# Patient Record
Sex: Female | Born: 1945 | Race: White | Hispanic: No | Marital: Single
Health system: Southern US, Community
[De-identification: ages and names within clinical notes are randomized; demographics above are authoritative.]

---

## 2008-05-09 ENCOUNTER — Ambulatory Visit: Payer: Self-pay | Admitting: Internal Medicine

## 2008-05-19 ENCOUNTER — Ambulatory Visit: Payer: Self-pay | Admitting: Family Medicine

## 2008-05-30 ENCOUNTER — Ambulatory Visit: Payer: Self-pay | Admitting: Family Medicine

## 2011-05-10 ENCOUNTER — Other Ambulatory Visit (HOSPITAL_COMMUNITY): Payer: Self-pay | Admitting: *Deleted

## 2011-05-10 DIAGNOSIS — S322XXA Fracture of coccyx, initial encounter for closed fracture: Secondary | ICD-10-CM

## 2011-05-13 ENCOUNTER — Ambulatory Visit (HOSPITAL_COMMUNITY)
Admission: RE | Admit: 2011-05-13 | Discharge: 2011-05-13 | Disposition: A | Discharge status: Home / Self Care | Payer: Medicare Other | Source: Ambulatory Visit | Attending: *Deleted | Admitting: *Deleted

## 2011-05-13 DIAGNOSIS — S322XXA Fracture of coccyx, initial encounter for closed fracture: Secondary | ICD-10-CM

## 2011-05-16 ENCOUNTER — Other Ambulatory Visit (HOSPITAL_COMMUNITY): Payer: Self-pay | Admitting: Interventional Radiology

## 2011-05-16 DIAGNOSIS — IMO0002 Reserved for concepts with insufficient information to code with codable children: Secondary | ICD-10-CM

## 2011-05-20 ENCOUNTER — Ambulatory Visit (HOSPITAL_COMMUNITY)
Admission: RE | Admit: 2011-05-20 | Discharge: 2011-05-20 | Disposition: A | Discharge status: Home / Self Care | Payer: Medicare Other | Source: Ambulatory Visit | Attending: Interventional Radiology | Admitting: Interventional Radiology

## 2011-05-20 DIAGNOSIS — M8448XA Pathological fracture, other site, initial encounter for fracture: Secondary | ICD-10-CM | POA: Insufficient documentation

## 2011-05-20 DIAGNOSIS — IMO0002 Reserved for concepts with insufficient information to code with codable children: Secondary | ICD-10-CM

## 2011-05-20 DIAGNOSIS — G35 Multiple sclerosis: Secondary | ICD-10-CM | POA: Insufficient documentation

## 2011-05-20 DIAGNOSIS — R569 Unspecified convulsions: Secondary | ICD-10-CM | POA: Insufficient documentation

## 2011-05-20 DIAGNOSIS — J449 Chronic obstructive pulmonary disease, unspecified: Secondary | ICD-10-CM | POA: Insufficient documentation

## 2011-05-20 DIAGNOSIS — J4489 Other specified chronic obstructive pulmonary disease: Secondary | ICD-10-CM | POA: Insufficient documentation

## 2011-05-20 LAB — CBC
MCV: 92.7 fL (ref 78.0–100.0)
Platelets: 234 10*3/uL (ref 150–400)
RBC: 4.53 MIL/uL (ref 3.87–5.11)
RDW: 13.7 % (ref 11.5–15.5)
WBC: 9.5 10*3/uL (ref 4.0–10.5)

## 2011-05-20 LAB — BASIC METABOLIC PANEL
CO2: 29 mEq/L (ref 19–32)
Chloride: 100 mEq/L (ref 96–112)
Creatinine, Ser: 0.61 mg/dL (ref 0.50–1.10)
GFR calc Af Amer: >60 mL/min (ref >60)
Potassium: 4 mEq/L (ref 3.5–5.1)

## 2011-05-20 LAB — PROTIME-INR
INR: 1.04 (ref 0.00–1.49)
Prothrombin Time: 13.8 seconds (ref 11.6–15.2)

## 2011-05-20 MED ORDER — IOHEXOL 300 MG/ML  SOLN: 50.0000 mL | Freq: Once | INTRAMUSCULAR | Status: AC | PRN

## 2011-05-23 ENCOUNTER — Other Ambulatory Visit (HOSPITAL_COMMUNITY): Payer: Self-pay | Admitting: Interventional Radiology

## 2011-05-23 DIAGNOSIS — Z09 Encounter for follow-up examination after completed treatment for conditions other than malignant neoplasm: Secondary | ICD-10-CM

## 2011-05-23 DIAGNOSIS — IMO0002 Reserved for concepts with insufficient information to code with codable children: Secondary | ICD-10-CM

## 2011-06-06 ENCOUNTER — Ambulatory Visit (HOSPITAL_COMMUNITY)
Admission: RE | Admit: 2011-06-06 | Discharge: 2011-06-06 | Disposition: A | Discharge status: Home / Self Care | Payer: Medicare Other | Source: Ambulatory Visit | Attending: Interventional Radiology | Admitting: Interventional Radiology

## 2011-06-06 DIAGNOSIS — Z09 Encounter for follow-up examination after completed treatment for conditions other than malignant neoplasm: Secondary | ICD-10-CM

## 2011-06-06 DIAGNOSIS — IMO0002 Reserved for concepts with insufficient information to code with codable children: Secondary | ICD-10-CM

## 2014-04-22 ENCOUNTER — Ambulatory Visit: Payer: Self-pay | Admitting: Internal Medicine

## 2014-04-22 ENCOUNTER — Inpatient Hospital Stay: Payer: Self-pay | Admitting: Internal Medicine

## 2014-04-22 LAB — URINALYSIS, COMPLETE
BILIRUBIN, UR: NEGATIVE
KETONE: NEGATIVE
LEUKOCYTE ESTERASE: NEGATIVE
NITRITE: NEGATIVE
PROTEIN: NEGATIVE
Ph: 5 (ref 4.5–8.0)
SQUAMOUS EPITHELIAL: NONE SEEN
Specific Gravity: 1.035 (ref 1.003–1.030)
WBC UR: 2 /HPF (ref 0–5)

## 2014-04-22 LAB — CBC WITH DIFFERENTIAL/PLATELET
BASOS ABS: 0 10*3/uL (ref 0.0–0.1)
BASOS PCT: 0.2 %
EOS ABS: 0 10*3/uL (ref 0.0–0.7)
EOS PCT: 0 %
HCT: 38.6 % (ref 35.0–47.0)
HGB: 12.1 g/dL (ref 12.0–16.0)
LYMPHS ABS: 1.2 10*3/uL (ref 1.0–3.6)
Lymphocyte %: 5.2 %
MCH: 29.1 pg (ref 26.0–34.0)
MCHC: 31.2 g/dL — ABNORMAL LOW (ref 32.0–36.0)
MCV: 93 fL (ref 80–100)
Monocyte #: 0.8 x10 3/mm (ref 0.2–0.9)
Monocyte %: 3.5 %
Neutrophil #: 20.5 10*3/uL — ABNORMAL HIGH (ref 1.4–6.5)
Neutrophil %: 91.1 %
Platelet: 284 10*3/uL (ref 150–440)
RBC: 4.15 10*6/uL (ref 3.80–5.20)
RDW: 16.8 % — ABNORMAL HIGH (ref 11.5–14.5)
WBC: 22.5 10*3/uL — ABNORMAL HIGH (ref 3.6–11.0)

## 2014-04-22 LAB — COMPREHENSIVE METABOLIC PANEL
ALBUMIN: 1.9 g/dL — AB (ref 3.4–5.0)
ANION GAP: 9 (ref 7–16)
Alkaline Phosphatase: 268 U/L — ABNORMAL HIGH
BILIRUBIN TOTAL: 0.3 mg/dL (ref 0.2–1.0)
BUN: 29 mg/dL — AB (ref 7–18)
CALCIUM: 9 mg/dL (ref 8.5–10.1)
CO2: 24 mmol/L (ref 21–32)
Chloride: 114 mmol/L — ABNORMAL HIGH (ref 98–107)
Creatinine: 1.14 mg/dL (ref 0.60–1.30)
EGFR (African American): 57 — ABNORMAL LOW
GFR CALC NON AF AMER: 49 — AB
Glucose: 547 mg/dL (ref 65–99)
Osmolality: 323 (ref 275–301)
POTASSIUM: 3.8 mmol/L (ref 3.5–5.1)
SGOT(AST): 17 U/L (ref 15–37)
SGPT (ALT): 47 U/L
Sodium: 147 mmol/L — ABNORMAL HIGH (ref 136–145)
TOTAL PROTEIN: 7.6 g/dL (ref 6.4–8.2)

## 2014-04-22 LAB — TROPONIN I

## 2014-04-22 LAB — HEMOGLOBIN A1C: Hemoglobin A1C: 8.2 % — ABNORMAL HIGH (ref 4.2–6.3)

## 2014-04-22 LAB — MAGNESIUM: MAGNESIUM: 2 mg/dL

## 2014-04-22 LAB — PHOSPHORUS: Phosphorus: 2.7 mg/dL (ref 2.5–4.9)

## 2014-04-22 LAB — PROTIME-INR
INR: 1.2
Prothrombin Time: 14.6 secs (ref 11.5–14.7)

## 2014-04-24 LAB — CBC WITH DIFFERENTIAL/PLATELET
BASOS PCT: 0.2 %
Basophil #: 0 10*3/uL (ref 0.0–0.1)
Eosinophil #: 0.1 10*3/uL (ref 0.0–0.7)
Eosinophil %: 0.3 %
HCT: 33.4 % — AB (ref 35.0–47.0)
HGB: 10.2 g/dL — ABNORMAL LOW (ref 12.0–16.0)
Lymphocyte #: 1.1 10*3/uL (ref 1.0–3.6)
Lymphocyte %: 4.6 %
MCH: 28.4 pg (ref 26.0–34.0)
MCHC: 30.6 g/dL — AB (ref 32.0–36.0)
MCV: 93 fL (ref 80–100)
MONO ABS: 0.6 x10 3/mm (ref 0.2–0.9)
MONOS PCT: 2.5 %
NEUTROS ABS: 22.9 10*3/uL — AB (ref 1.4–6.5)
Neutrophil %: 92.4 %
PLATELETS: 248 10*3/uL (ref 150–440)
RBC: 3.6 10*6/uL — ABNORMAL LOW (ref 3.80–5.20)
RDW: 16.7 % — ABNORMAL HIGH (ref 11.5–14.5)
WBC: 24.8 10*3/uL — ABNORMAL HIGH (ref 3.6–11.0)

## 2014-04-24 LAB — BASIC METABOLIC PANEL
Anion Gap: 6 — ABNORMAL LOW (ref 7–16)
BUN: 11 mg/dL (ref 7–18)
CHLORIDE: 101 mmol/L (ref 98–107)
CREATININE: 0.76 mg/dL (ref 0.60–1.30)
Calcium, Total: 8.4 mg/dL — ABNORMAL LOW (ref 8.5–10.1)
Co2: 31 mmol/L (ref 21–32)
EGFR (African American): >60
EGFR (Non-African Amer.): >60
GLUCOSE: 310 mg/dL — AB (ref 65–99)
OSMOLALITY: 287 (ref 275–301)
POTASSIUM: 2.9 mmol/L — AB (ref 3.5–5.1)
SODIUM: 138 mmol/L (ref 136–145)

## 2014-04-24 LAB — URINE CULTURE

## 2014-04-24 LAB — CREATININE, SERUM
CREATININE: 0.83 mg/dL (ref 0.60–1.30)
EGFR (Non-African Amer.): >60

## 2014-04-25 LAB — BASIC METABOLIC PANEL
ANION GAP: 6 — AB (ref 7–16)
BUN: 6 mg/dL — ABNORMAL LOW (ref 7–18)
CALCIUM: 8.5 mg/dL (ref 8.5–10.1)
CHLORIDE: 107 mmol/L (ref 98–107)
CO2: 27 mmol/L (ref 21–32)
Creatinine: 0.7 mg/dL (ref 0.60–1.30)
Glucose: 150 mg/dL — ABNORMAL HIGH (ref 65–99)
Osmolality: 280 (ref 275–301)
Potassium: 3.6 mmol/L (ref 3.5–5.1)
Sodium: 140 mmol/L (ref 136–145)

## 2014-04-25 LAB — PLATELET COUNT: PLATELETS: 247 10*3/uL (ref 150–440)

## 2014-04-26 LAB — CBC WITH DIFFERENTIAL/PLATELET
BASOS PCT: 0.3 %
Basophil #: 0 10*3/uL (ref 0.0–0.1)
EOS ABS: 0.1 10*3/uL (ref 0.0–0.7)
Eosinophil %: 0.6 %
HCT: 30.1 % — AB (ref 35.0–47.0)
HGB: 9.4 g/dL — ABNORMAL LOW (ref 12.0–16.0)
Lymphocyte #: 1.3 10*3/uL (ref 1.0–3.6)
Lymphocyte %: 7.1 %
MCH: 28 pg (ref 26.0–34.0)
MCHC: 31.3 g/dL — AB (ref 32.0–36.0)
MCV: 90 fL (ref 80–100)
MONO ABS: 0.8 x10 3/mm (ref 0.2–0.9)
MONOS PCT: 4.1 %
NEUTROS ABS: 16.3 10*3/uL — AB (ref 1.4–6.5)
Neutrophil %: 87.9 %
Platelet: 299 10*3/uL (ref 150–440)
RBC: 3.37 10*6/uL — ABNORMAL LOW (ref 3.80–5.20)
RDW: 16.4 % — ABNORMAL HIGH (ref 11.5–14.5)
WBC: 18.5 10*3/uL — ABNORMAL HIGH (ref 3.6–11.0)

## 2014-04-26 LAB — BASIC METABOLIC PANEL
Anion Gap: 3 — ABNORMAL LOW (ref 7–16)
BUN: 9 mg/dL (ref 7–18)
CALCIUM: 8.4 mg/dL — AB (ref 8.5–10.1)
CO2: 32 mmol/L (ref 21–32)
Chloride: 106 mmol/L (ref 98–107)
Creatinine: 0.75 mg/dL (ref 0.60–1.30)
EGFR (Non-African Amer.): >60
Glucose: 84 mg/dL (ref 65–99)
Osmolality: 279 (ref 275–301)
POTASSIUM: 3.2 mmol/L — AB (ref 3.5–5.1)
Sodium: 141 mmol/L (ref 136–145)

## 2014-04-27 LAB — CULTURE, BLOOD (SINGLE)

## 2014-05-22 ENCOUNTER — Ambulatory Visit: Payer: Self-pay | Admitting: Internal Medicine

## 2014-07-31 ENCOUNTER — Inpatient Hospital Stay: Payer: Self-pay | Admitting: Internal Medicine

## 2014-07-31 LAB — URINALYSIS, COMPLETE
BILIRUBIN, UR: NEGATIVE
Glucose,UR: >=500 mg/dL (ref 0–75)
KETONE: NEGATIVE
NITRITE: NEGATIVE
Ph: 5 (ref 4.5–8.0)
RBC,UR: 2 /HPF (ref 0–5)
Specific Gravity: 1.021 (ref 1.003–1.030)
Squamous Epithelial: 4
WBC UR: 5 /HPF (ref 0–5)

## 2014-07-31 LAB — COMPREHENSIVE METABOLIC PANEL
ALBUMIN: 2.1 g/dL — AB (ref 3.4–5.0)
ALT: 26 U/L
ANION GAP: 9 (ref 7–16)
Alkaline Phosphatase: 205 U/L — ABNORMAL HIGH
BUN: 40 mg/dL — AB (ref 7–18)
Bilirubin,Total: 0.2 mg/dL (ref 0.2–1.0)
CHLORIDE: 122 mmol/L — AB (ref 98–107)
CO2: 28 mmol/L (ref 21–32)
Calcium, Total: 8.9 mg/dL (ref 8.5–10.1)
Creatinine: 1.17 mg/dL (ref 0.60–1.30)
GFR CALC AF AMER: 59 — AB
GFR CALC NON AF AMER: 49 — AB
Glucose: 294 mg/dL — ABNORMAL HIGH (ref 65–99)
Osmolality: 335 (ref 275–301)
Potassium: 3.3 mmol/L — ABNORMAL LOW (ref 3.5–5.1)
SGOT(AST): 23 U/L (ref 15–37)
Sodium: 159 mmol/L — ABNORMAL HIGH (ref 136–145)
Total Protein: 8.2 g/dL (ref 6.4–8.2)

## 2014-07-31 LAB — CBC WITH DIFFERENTIAL/PLATELET
BASOS ABS: 0 10*3/uL (ref 0.0–0.1)
Basophil %: 0 %
EOS PCT: 0.3 %
Eosinophil #: 0.1 10*3/uL (ref 0.0–0.7)
HCT: 41.4 % (ref 35.0–47.0)
HGB: 12.7 g/dL (ref 12.0–16.0)
LYMPHS PCT: 5.7 %
Lymphocyte #: 1.2 10*3/uL (ref 1.0–3.6)
MCH: 28.3 pg (ref 26.0–34.0)
MCHC: 30.6 g/dL — AB (ref 32.0–36.0)
MCV: 92 fL (ref 80–100)
MONO ABS: 0.7 x10 3/mm (ref 0.2–0.9)
MONOS PCT: 3.3 %
Neutrophil #: 19.6 10*3/uL — ABNORMAL HIGH (ref 1.4–6.5)
Neutrophil %: 90.7 %
Platelet: 262 10*3/uL (ref 150–440)
RBC: 4.48 10*6/uL (ref 3.80–5.20)
RDW: 17.1 % — ABNORMAL HIGH (ref 11.5–14.5)
WBC: 21.6 10*3/uL — AB (ref 3.6–11.0)

## 2014-07-31 LAB — PHOSPHORUS: Phosphorus: 2.6 mg/dL (ref 2.5–4.9)

## 2014-07-31 LAB — PROTIME-INR
INR: 1.3
Prothrombin Time: 15.7 secs — ABNORMAL HIGH (ref 11.5–14.7)

## 2014-07-31 LAB — MAGNESIUM: MAGNESIUM: 2.4 mg/dL

## 2014-07-31 LAB — TROPONIN I: Troponin-I: <0.02 ng/mL

## 2014-08-01 LAB — CBC WITH DIFFERENTIAL/PLATELET
BASOS PCT: 0.2 %
Basophil #: 0 10*3/uL (ref 0.0–0.1)
EOS ABS: 0.3 10*3/uL (ref 0.0–0.7)
EOS PCT: 1.5 %
HCT: 34.7 % — ABNORMAL LOW (ref 35.0–47.0)
HGB: 10.3 g/dL — ABNORMAL LOW (ref 12.0–16.0)
LYMPHS ABS: 2.6 10*3/uL (ref 1.0–3.6)
Lymphocyte %: 13 %
MCH: 27.9 pg (ref 26.0–34.0)
MCHC: 29.7 g/dL — AB (ref 32.0–36.0)
MCV: 94 fL (ref 80–100)
MONOS PCT: 3 %
Monocyte #: 0.6 x10 3/mm (ref 0.2–0.9)
NEUTROS ABS: 16.3 10*3/uL — AB (ref 1.4–6.5)
NEUTROS PCT: 82.3 %
PLATELETS: 199 10*3/uL (ref 150–440)
RBC: 3.69 10*6/uL — AB (ref 3.80–5.20)
RDW: 17.6 % — AB (ref 11.5–14.5)
WBC: 19.8 10*3/uL — ABNORMAL HIGH (ref 3.6–11.0)

## 2014-08-01 LAB — BASIC METABOLIC PANEL
Anion Gap: 6 — ABNORMAL LOW (ref 7–16)
BUN: 25 mg/dL — ABNORMAL HIGH (ref 7–18)
CALCIUM: 7.7 mg/dL — AB (ref 8.5–10.1)
CO2: 23 mmol/L (ref 21–32)
Chloride: 127 mmol/L — ABNORMAL HIGH (ref 98–107)
Creatinine: 0.89 mg/dL (ref 0.60–1.30)
EGFR (African American): >60
EGFR (Non-African Amer.): >60
Glucose: 151 mg/dL — ABNORMAL HIGH (ref 65–99)
Osmolality: 316 (ref 275–301)
POTASSIUM: 4.1 mmol/L (ref 3.5–5.1)
SODIUM: 156 mmol/L — AB (ref 136–145)

## 2014-08-02 LAB — CBC WITH DIFFERENTIAL/PLATELET
Basophil #: 0 10*3/uL (ref 0.0–0.1)
Basophil %: 0.2 %
Eosinophil #: 0.8 10*3/uL — ABNORMAL HIGH (ref 0.0–0.7)
Eosinophil %: 5.6 %
HCT: 38.4 % (ref 35.0–47.0)
HGB: 11.7 g/dL — ABNORMAL LOW (ref 12.0–16.0)
LYMPHS PCT: 14.9 %
Lymphocyte #: 2.2 10*3/uL (ref 1.0–3.6)
MCH: 27.9 pg (ref 26.0–34.0)
MCHC: 30.5 g/dL — AB (ref 32.0–36.0)
MCV: 91 fL (ref 80–100)
MONO ABS: 0.5 x10 3/mm (ref 0.2–0.9)
MONOS PCT: 3.7 %
NEUTROS ABS: 11.1 10*3/uL — AB (ref 1.4–6.5)
Neutrophil %: 75.6 %
Platelet: 209 10*3/uL (ref 150–440)
RBC: 4.21 10*6/uL (ref 3.80–5.20)
RDW: 17.3 % — ABNORMAL HIGH (ref 11.5–14.5)
WBC: 14.7 10*3/uL — ABNORMAL HIGH (ref 3.6–11.0)

## 2014-08-02 LAB — BASIC METABOLIC PANEL
ANION GAP: 8 (ref 7–16)
BUN: 8 mg/dL (ref 7–18)
CALCIUM: 7.7 mg/dL — AB (ref 8.5–10.1)
CO2: 27 mmol/L (ref 21–32)
Chloride: 107 mmol/L (ref 98–107)
Creatinine: 0.63 mg/dL (ref 0.60–1.30)
EGFR (African American): >60
GLUCOSE: 92 mg/dL (ref 65–99)
OSMOLALITY: 281 (ref 275–301)
Potassium: 3.2 mmol/L — ABNORMAL LOW (ref 3.5–5.1)
Sodium: 142 mmol/L (ref 136–145)

## 2014-08-02 LAB — POTASSIUM: POTASSIUM: 3 mmol/L — AB (ref 3.5–5.1)

## 2014-08-02 LAB — SODIUM: Sodium: 139 mmol/L (ref 136–145)

## 2014-08-02 LAB — MAGNESIUM: MAGNESIUM: 1.9 mg/dL

## 2014-08-03 LAB — BASIC METABOLIC PANEL
ANION GAP: 5 — AB (ref 7–16)
BUN: 8 mg/dL (ref 7–18)
CHLORIDE: 104 mmol/L (ref 98–107)
CREATININE: 0.81 mg/dL (ref 0.60–1.30)
Calcium, Total: 8.1 mg/dL — ABNORMAL LOW (ref 8.5–10.1)
Co2: 29 mmol/L (ref 21–32)
EGFR (Non-African Amer.): >60
GLUCOSE: 205 mg/dL — AB (ref 65–99)
Osmolality: 280 (ref 275–301)
POTASSIUM: 3.7 mmol/L (ref 3.5–5.1)
Sodium: 138 mmol/L (ref 136–145)

## 2014-08-03 LAB — URINE CULTURE

## 2014-08-03 LAB — CLOSTRIDIUM DIFFICILE(ARMC)

## 2014-08-04 LAB — CBC WITH DIFFERENTIAL/PLATELET
Basophil #: 0 10*3/uL (ref 0.0–0.1)
Basophil %: 0 %
EOS PCT: 0.1 %
Eosinophil #: 0 10*3/uL (ref 0.0–0.7)
HCT: 39.9 % (ref 35.0–47.0)
HGB: 12.3 g/dL (ref 12.0–16.0)
Lymphocyte #: 1.2 10*3/uL (ref 1.0–3.6)
Lymphocyte %: 10.6 %
MCH: 28.2 pg (ref 26.0–34.0)
MCHC: 30.9 g/dL — AB (ref 32.0–36.0)
MCV: 91 fL (ref 80–100)
MONO ABS: 0.1 x10 3/mm — AB (ref 0.2–0.9)
MONOS PCT: 1 %
NEUTROS PCT: 88.3 %
Neutrophil #: 10.1 10*3/uL — ABNORMAL HIGH (ref 1.4–6.5)
PLATELETS: 246 10*3/uL (ref 150–440)
RBC: 4.37 10*6/uL (ref 3.80–5.20)
RDW: 16.7 % — AB (ref 11.5–14.5)
WBC: 11.4 10*3/uL — AB (ref 3.6–11.0)

## 2014-08-05 LAB — CULTURE, BLOOD (SINGLE)

## 2014-08-05 LAB — STOOL CULTURE

## 2014-09-22 DEATH — deceased

## 2014-12-13 NOTE — H&P (Signed)
PATIENT NAME:  Frances Potter, Frances Potter MR#:  [id number] DATE OF BIRTH:  06-24-1946  DATE OF ADMISSION:  04/22/2014  REFERRING PHYSICIAN: Coolidge BreezeGraydon S. Goodman, MD  PRIMARY CARE PHYSICIAN: Burley SaverL. Katherine Bliss, MD    CHIEF COMPLAINT: Fever.   HISTORY OF PRESENT ILLNESS: A 69 year old Caucasian female with a history of multiple sclerosis who currently resides at a nursing facility and presented with fever. Patient herself is unable to provide any meaningful information given mental status and medical condition; however, fever noted by caregiver documented at 101 degrees Fahrenheit. She also describes having associated 1- to 2-day duration of cough, nonproductive in nature, but states the patient does sound markedly congested as well as increased lethargy today. On EMS arrival, noted to be hypoxemic, requiring supplemental O2 to maintain SaO2 greater than 92%. Once again, the patient unable to provide any meaningful information given mental status and medical condition.  REVIEW OF SYSTEMS: Unobtainable at this time given mental status and medical condition.   PAST MEDICAL HISTORY: Multiple sclerosis, anxiety and depression not otherwise specified.   SOCIAL HISTORY: No alcohol, tobacco, or drug usage. Currently resides at [facility]. At baseline, she is bedbound. She is also confused but conversant, with a change noted today by her normal caregiver.   FAMILY HISTORY: Unobtainable given patient's mental status and medical condition.   ALLERGIES: No known drug allergies.   HOME MEDICATIONS: Ultram extended release 100 mg p.o. at bedtime, Effexor extended release 150 mg p.o. q. daily, Remeron 50 mg p.o. at bedtime, trazodone 50 mg p.o. at bedtime, albuterol 2.5 mg 3 mL inhalation once daily, DuoNeb treatments 3 mL inhalation q. 4 hours as needed for shortness of breath, Senokot 1 tab p.o. 3 times daily, Macrobid 100 mg p.o. q. daily, Citracal plus D 1 tab p.o. q. daily, vitamin B complex 1 tab p.o. q. daily,  multivitamin 1 tab p.o. q. daily, folic acid 0.4 mg p.o. q. daily, vitamin B12, 500 mcg 2 tabs sublingual q. daily.   PHYSICAL EXAMINATION:  VITAL SIGNS: Temperature 100.7 degrees Fahrenheit, heart rate 112, respirations 18, blood pressure 111/80, saturating 88% on supplemental O2; currently saturating 100% on supplemental O2. Weight 51.7 kg, BMI 20.9.  GENERAL: Chronically ill and frail-appearing Caucasian female currently in minimal distress given respiratory status.  HEAD: Normocephalic, atraumatic.  EYES: Pupils equal, round, reactive to light. Extraocular muscles intact. No scleral icterus.  MOUTH: Moist mucous membranes. Dentition intact. No abscess noted. EARS, NOSE, THROAT: Clear without exudates. No external lesions.  NECK: Supple. No thyromegaly. No nodules. No JVD.  PULMONARY: Diffuse coarse breath sounds bilaterally with scant rhonchi at the bases, most prominent at the left base. Good air entry bilaterally.  CHEST: Nontender to palpation.  CARDIOVASCULAR: S1, S2. Tachycardic. No murmurs, rubs, or gallops. No edema. Pedal pulses 2+ bilaterally. GASTROINTESTINAL: Soft, nontender, nondistended. No masses. Positive bowel sounds. No hepatosplenomegaly.  MUSCULOSKELETAL: No swelling, clubbing, or edema. Passive range of motion full in all extremities.  NEUROLOGIC: Unable to fully assess given patient's mental status and medical condition. However, she does respond to simple commands in a seemingly appropriate fashion. SKIN: No ulceration, lesions, rash, cyanosis. Skin warm, dry. Turgor intact.  PSYCHIATRIC: Unable to fully assess given patient's mental status and medical condition. However, mood, affect blunted. She is awake; however, unable to answer any questions. Insight and judgment would be considered poor at this time.  LABORATORY DATA: Chest x-ray performed revealing asymmetric coarse interstitial air-space opacities. Remainder of laboratory data: Sodium 147, potassium 3.8, chloride  114, bicarbonate  24, BUN 29, creatinine 1.14, glucose 547. LFTs: Albumin 1.9, alkaline phosphatase 268. WBC 22.5, hemoglobin 12.1, platelets of 284,000. Urinalysis negative for evidence of infection. Lactic acid of 2.7.   ASSESSMENT AND PLAN: A 69 year old Caucasian female with a history of multiple sclerosis presenting with fever.  1.  Sepsis. Meeting septic criteria by leukocytosis, heart rate. Temperature is most likely secondary to healthcare-associated pneumonia. Panculture including blood, sputum, and urine. Intravenous fluid hydration to keep mean arterial pressure greater than 65. Antibiotic coverage with vancomycin, Zosyn, Levaquin for healthcare-associated pneumonia coverage. Provide supplemental oxygen to keep oxygen saturation greater than 92% as well as DuoNeb treatments q. 4 hours.  2.  Hyperglycemia. Not a known diabetic. Insulin sliding scale with q. 6 hour Accu-Cheks. Check hemoglobin A1c.  3.  Hypernatremia with a corrected sodium level of 151. Intravenous fluid hydration as above, Whenever blood pressure is stabilized, can change the normal saline to half normal saline to increase free water.  4.  Lactic acidosis. Intravenous fluid hydration as above to keep mean arterial pressure greater than 65. 5.  Venous thromboembolism prophylaxis with heparin subcutaneous.  CODE STATUS:  Patient  full code as discussed with bedside caregiver at length.   TIME SPENT: 45 minutes.    ____________________________ Cletis Athens. Jamieson Hetland, MD dkh:ST D: 04/22/2014 22:25:51 ET T: 04/22/2014 22:58:30 ET JOB#: [id number]  cc: Cletis Athens. Ixchel Duck, MD, <Dictator> Reine Bristow Synetta Shadow MD ELECTRONICALLY SIGNED 04/23/2014 0:33

## 2014-12-13 NOTE — Discharge Summary (Signed)
PATIENT NAME:  Frances Potter, Frances Potter MR#:  [id number] DATE OF BIRTH:  08-Feb-1946  DATE OF ADMISSION:  07/31/2014 DATE OF DISCHARGE: 08/04/2014   ADMITTING DIAGNOSES: Sepsis, pneumonia.   DISCHARGE DIAGNOSES:  1.  Systemic inflammatory response syndrome.  2.  Acute respiratory failure with hypoxia.  3.  Bilateral pneumonia due to unclear bacterial agent; suspected aspiration pneumonia.  4.  Dysphagia, now on pureed diet with nectar thick liquids.  5.  Dehydration.  6.  Hyponatremia due to dehydration.  7.  Insulin dependent diabetes mellitus.  8.  Anemia of chronic disease.  9.  Hypokalemia.  10.  Recurrent rash of unclear etiology, suspect a drug eruption.  11.  Insomnia, due to pruritic rash and steroids.  12.  History of stroke.  13.  Multiple sclerosis.  14.  Post infarct dementia with expressive aphasia.  15.  Depression.  16.  Anxiety.  17.  Right-sided weakness due to stroke, and a wheelchair bound status.   DISCHARGE CONDITION: Stable.   DISCHARGE MEDICATIONS:  1.  The patient is to continue albuterol 3 mL once daily.  2.  Effexor-XR 150 mg p.o. daily.  3.  Trazodone 50 mg p.o. at bedtime.  4.  DuoNebs 30 mL every 4 hours as needed.  5.  Atropine ophthalmic solution 3-5 drops every 2 hours as needed for excessive secretions.  6.  Hydroxyzine/hydrochloride 25 mg twice daily.  7.  Senokot 8.6 mg 3 times daily.  8.  Levemir 10 units subcutaneously at bedtime.  9.  Remeron 15 mg p.o. at bedtime.  10.  MetPhos 2 ounces k times daily.  11.  Lorazepam 0.5 mg every 4 hours as needed.  12.  Tramadol extended release 100 mg once daily as needed.  13.  Morphine 100 mL in 5 mL oral solution, 0.25 mL every 2 hours as needed.  14.  Prednisone taper 50 mg p.o. once on 08/05/2014, then taper by 10 mg every 2 days until stopped.  15.  Adoxa, which doxycycline monohydrate 100 mg twice daily for 9 more days.  16.  Insulin aspart sliding scale subcutaneously 4 times daily before meals and at  bedtime as needed.  17.  Doxepin 25 mg p.o. in the evening as needed.  18.  Docusate sodium 100 mg twice daily as needed. The patient is not to take Levaquin.   HOME OXYGEN: None.   DIET: 2 g salt, low-fat, low-cholesterol, carbohydrate-controlled diet. Pureed consistency, nectar thick liquids.   ACTIVITY LIMITATIONS: As tolerated.   REFERRAL: To hospice at home, as well as speech therapy.    FOLLOWUP APPOINTMENTS: With Dr. Quillian Quince in 2 days after discharge.   CONSULTANTS: Care management, social work, speech therapy.   RADIOLOGIC STUDIES: Chest x-ray, portable single view, 07/31/2014 revealing interval improvement of right lower lung air space disease since the prior exam; no significant change in mild patchy left lower lobe airspace disease.   HOSPITAL COURSE: The patient is a 68 year old Caucasian female with history of MS as well as stroke, who has expressive aphasia as well as right-sided weakness and wheelchair bound status, who presents to the hospital with complaints of difficulty breathing. Please refer to Dr. Prudencio Pair admission note on 07/31/2014. On arrival to the hospital, she was hypoxic with oxygen saturation of 88% on room air. She was also noted to have pyuria, which was concerning for possible UTI.   The patient's arrival vitals were temperature 98.4, pulse 122, respiration rate 26, blood pressure 128/62, saturation of 88% on room air. Physical  exam revealed bibasilar breath sounds bilaterally, with no wheezes or crackles. A few rhonchi were heard, and no use of accessory muscles to breathe.   The patient's lab data done on arrival to the hospital showed a sodium level of 159, potassium 3.3, BUN 40, glucose of 294; otherwise, BMP was unremarkable. The patient's albumin level was 2.1, alkaline phosphatase 205; otherwise, comprehensive metabolic panel was normal. Troponin less than 0.02. White blood cell count is elevated at 21.6, hemoglobin was 12.7, platelet count was 262,000,  with absolute neutrophil count of 19.6. Coagulation Panel: Pro-time was 14.7. INR was 1.3. Blood cultures taken on 05/01/2014 showed no growth. Urinalysis revealed pyuria with 5 white blood cells, 2 red blood cells, 2+ leukocyte esterase, 1+ bacteria. Urine cultures showed 20,000 colony-forming units of Escherichia coli resistant to trimethoprim sulfamethoxazole as well as ampicillin, and resistant to ciprofloxacin and levofloxacin; however, sensitive to nitrofurantoin, cefazolin, ceftriaxone as well as imipenem, and intermittently sensitive to gentamicin.   The patient was admitted to the hospital for further evaluation. She was started on broad-spectrum antibiotic therapy with which her condition slowly improved. She initially was very hypoxic, requiring 5 L of oxygen through nasal cannula with oxygen saturation of 89% on 5 L on the day of admission, 07/31/2014. She slowly improved in her condition, and her oxygenation improved as well. On the day of discharge, 08/04/2014, the patient's oxygen saturations were 94% on room air at rest. It was felt that the patient had acute respiratory failure due to pneumonia; however, bacterial agent was unclear, since the patient did not produce any sputum for cultures. Respiration was also implicated, and speech therapist evaluated patient and found her to have dysphagia. The patient was placed on a pureed diet with nectar thick liquids. It is recommended to follow the patient's ability to eat, and to follow up with speech therapist for further recommendations and advancement of her diet, if possible. In regard to dehydration, which the patient was noted to have on arrival with a severe hyponatremia, the patient was rehydrated and her sodium level improved. On 08/03/2014, her sodium level was found to be 138 and potassium level also improved at a level of 3.7 on the same day, 08/03/2013. It is recommended to follow the patient's sodium level as well as potassium levels as an  outpatient, since she is prone to dehydration due to her thickened liquid diet as well as insulin-dependent diabetes mellitus, which is also going to get worse since the patient is going to be continued on steroid therapy for her rash.   In regards to diabetes mellitus, the patient was initiated on a diabetic diet, whenever she was felt to be okay to eat by speech therapist. She is to continue insulin as per  prior recommendations. She was recommended also to be added short-acting insulin as needed, since she is on steroid taper at present. In regard to hypokalemia, as mentioned above, the patient's potassium level improved.   With therapy, the patient's rash, which he has been having for approximately a month now recurred, and was very aggressive and intense. The patient became insomnic due to pruritic rash. She was initiated on steroids, and the rash was felt to be a drug eruption. With steroids, her condition somewhat improved; however, did not completely resolve. The patient's antibiotics were stopped, and the patient was initiated on doxycycline. With this, her condition is improved. It is recommended to taper her steroids and get dermatology consultation as an outpatient if rash worsens again. It is  also recommended to possibly take her off medications intermittently, trying to find out which medication could cause her this very pleuritic rash.   In regard to her chronic medical problems such as stroke with post infarct dementia, expressive aphasia, right-sided weakness, also anxiety, depression and MS, she is to continue her outpatient management; no changes were made.   The patient is being discharged in stable condition with the above-mentioned medications and followup. She is referred to hospice in the facility. She is to follow up with her primary care physician in the next few days after discharge. On the day of discharge, the patient's vital signs, temperature was 97.3, pulse was ranging from  108-114, respiration rate was 18-20, blood pressure 139/86, saturation was 94% on room air at rest.   TIME SPENT: 40 minutes.    ____________________________ Katharina Caper, MD rv:MT D: 08/04/2014 11:26:13 ET T: 08/04/2014 12:30:17 ET JOB#: [id number]  cc: Katharina Caper, MD, <Dictator> Jacion Dismore MD ELECTRONICALLY SIGNED 08/21/2014 17:15

## 2014-12-13 NOTE — Consult Note (Signed)
   Comments   Meeting held with sister, Thurston Holenne, and [place] [employer] Pless.  Pt underlying conditions and guarded prognosis explained.  Both Thurston HoleAnne and Leonette MostCharles agree that pt should be a DNR/DNI.  Thurston Holenne is pt's only living relative and she defers all medical decisions to be made by Leonette Mostharles in the future.  Leonette Mostharles and Thurston Holenne would want pt to be treated for PNA and want her to return to [facility].  Both agree with hospice upon return. Primary Diagnosis of MS, secondary: Adult FTT and protein calorie malnutrition. 30% info: Pless: Home [phone], cell: [phone], Office: [phone] Cell: [phone], home: [phone] Clearence Cheek(Friend): [phone]  Electronic Signatures: Reather LaurenceMantzouris, Robbert Langlinais J (NP)  (Signed 02-Sep-15 15:29)  Authored: Palliative Care Phifer, Harriett SineNancy (MD)  (Signed 02-Sep-15 20:09)  Authored: Palliative Care   Last Updated: 02-Sep-15 20:09 by Phifer, Harriett SineNancy (MD)

## 2014-12-13 NOTE — Consult Note (Signed)
   Comments   Alternate numbers if unable to get ahold of [employer]MC[employer] Pless [facility]) this weekend call Hall Busingiffany whaler (his assistant) [phone] or [phone] she will track him down if we can't get ahold of him.  Electronic Signatures: Reather LaurenceMantzouris, Barrie Wale J (NP)  (Signed 03-Sep-15 11:45)  Authored: Palliative Care   Last Updated: 03-Sep-15 11:45 by Reather LaurenceMantzouris, Audry Pecina J (NP)

## 2014-12-13 NOTE — Discharge Summary (Signed)
PATIENT NAME:  Frances Potter, Frances Potter MR#:  [id number] DATE OF BIRTH:  [birth year]  DATE OF ADMISSION:  04/22/2014 DATE OF DISCHARGE:  04/26/2014  ADMITTING PHYSICIAN: Dr. Valentino Nose  ADMITTING DIAGNOSES:  1.  Sepsis secondary to pneumonia.  2.  Acute respiratory failure with hypoxemia due to pneumonia.  3.  Diabetes mellitus type 2.  4.  Hypernatremia.  5.  Depression.  6.  Multiple sclerosis.  7.  Dementia.  8.  History of cerebrovascular accident after neurological surgery.  9.  Protein calorie malnutrition.  10.  Failure to thrive.   CONSULTATIONS: Dr. Izora Gala Phifer, palliative care.   PROCEDURES:   1.  Chest x-ray September 1st shows asymmetric coarse interstitial and airspace opacities of uncertain chronicity.  2.  Chest x-ray September 3rd shows slight interval progression of patchy airspace disease in the right middle and lower lobes concerning for pneumonia. Incrementally increased left basilar atelectasis versus infiltrate.   BRIEF HISTORY OF PRESENT ILLNESS: This 69 year old female with history of multiple sclerosis, CVA, and dementia, residing at a nursing facility, presents with fever. The patient is unable to provide the history. Caregiver noted fever at 101 degrees. The patient also presented with cough and increased lethargy. On arrival to the ED, she was noted to be hypoxemic requiring supplemental oxygen.   HOSPITAL COURSE AND TREATMENT: 1.  Sepsis due to pneumonia: The patient met sepsis criteria with very elevated white blood cell count at greater than 20 and tachycardia and fever. Chest x-ray shows pneumonia. Blood cultures no growth. Urine culture no growth. She was placed on broad-spectrum antibiotics for hospital-acquired pneumonia given that she is a nursing home resident. She was started on vancomycin, Levaquin and Zosyn on September 1st. Cultures have been negative. She is discharged on PO levaquin and will continue until September 10th for a total of 10 days of treatment.  At this point, she is clinically improving with improved mental status and respiratory status. White blood cell count remains elevated, but decreasing. Chest x-ray remains significant for pneumonia. Her oxygen requirement has decreased and she is on nasal cannula.  2.  Acute respiratory failure with hypoxemia due to pneumonia: The patient required high flow oxygen initially and after treatment was able to wean down to 5 liters via nasal cannula. Prior to admission she was not on oxygen. Hopefully with continued treatment she will be able to wean down further. She will need continued respiratory therapy at her skilled nursing facility.  3.  Diabetes mellitus, type II: Hemoglobin A1c checked in hospital is 8.2. She was started on Levemir. In the hospital she was maintained on sliding scale insulin, but this was determined to be too disruptive for the patient. She can continue with Levemir once a day and CBG check once a day in the morning. She does not need tight glycemic control. In this age group and in patients with chronic medical conditions such as this, a Hemoglobin A1c goal would be to be less than 8. She should be monitored closely for signs of hypoglycemia, and if this occurs Levemir dose should be decreased.  4.  Hypernatremia: Resolved with IV fluids.  5.  Depression: Stable. We will continue on prior regimen.  6.  Multiple sclerosis: Not on any current medications at this time. She is followed by a neurologist at [facility].  7.  Dementia: Stable.  8.  CVA after neurologic surgery: Stable.   DISPOSITION: This patient is DNR and will continue with hospice care at [place] facility.  PHYSICAL EXAMINATION:  VITAL SIGNS: Temperature 99.8, pulse 112, respirations 20, blood pressure 116/74, oxygenation 94% on 5 liters nasal cannula.  GENERAL: The patient appears critically ill, she is alert, in no acute distress at this time.  PULMONARY: The patient is on nasal cannula oxygen at 5  liters, no respiratory distress, lungs with diffuse rhonchi, shallow respirations, poor air movement.  CARDIOVASCULAR: Regular rate and rhythm, no murmurs, rubs, or gallops.  ABDOMEN: Soft, nontender, nondistended. Bowel sounds are normal.  EXTREMITIES: The patient is diffusely weak, she has +1 peripheral edema, pulses are +1.  SKIN: Normal to palpation, pale.  NEUROLOGIC: The patient is alert to voice, she is oriented to time, seems oriented to place and person, she is fatigued.   DIAGNOSTIC DATA: Sodium 140, potassium 3.6, chloride 107, bicarb 27, BUN 6, creatinine 0.7. White blood cell count 24.8, hemoglobin 10.2, platelets 248,000, MCV 93.   CONDITION ON DISCHARGE: The patient continues to be critically ill, but stable. She is being treated aggressively for hospital-acquired pneumonia. She has significant underlying medical conditions which contribute to a very poor prognosis. She is discharged to a skilled nursing facility with hospice following. She continues to require 5 liters of oxygen via nasal cannula.   DISCHARGE DIET: Per speech therapy recommendation.   DISCHARGE ACTIVITY: As tolerated.   DISCHARGE FOLLOWUP: The patient will follow up with hospice MD.   DISCHARGE MEDICATIONS: 1.  Albuterol 3 mL inhaled once a day in the morning. 2.  Effexor 150 mg oral capsule extended release 1 capsule once a day.  3.  Remeron 15 mg 1 tablet once a day at bedtime.  4.  Senokot 1 tablet 3 times a day.  5.  Trazodone 50 mg 1 tablet once a day at bedtime.  6.  DuoNeb 3 mL inhaled every 4 hours as needed for shortness of breath.  7.  Tramadol 50 mg 2 tablets once a day at bedtime.  8.  Insulin Detemir 100 units/mL subcutaneous solution 10 units subcutaneously at bedtime.  10.  Levofloxacin 750 mg oral tablet every 24 hours until September 10th and then stop.  11.  Glucerna shake 237 mL orally twice a day.   TIME SPENT ON DISCHARGE: 45 minutes.  ____________________________ Earleen Newport.  Volanda Napoleon, MD cpw:sb D: 04/25/2014 16:37:00 ET T: 04/25/2014 17:11:03 ET JOB#: [id number]  cc: Barnetta Chapel P. Volanda Napoleon, MD, <Dictator> Aldean Jewett MD ELECTRONICALLY SIGNED 04/26/2014 14:13

## 2014-12-17 NOTE — H&P (Signed)
PATIENT NAME:  Frances Potter, Frances Potter MR#:  [id number] DATE OF BIRTH:  06/23/1946  DATE OF ADMISSION:  07/31/2014  ADMITTING PHYSICIAN: Enid Baas, MD    PRIMARY CARE PHYSICIAN: Burley Saver, MD   CHIEF COMPLAINT: Difficulty breathing.   HISTORY OF PRESENT ILLNESS: Frances Potter is a 69 year old Caucasian female with a past medical history significant for severe multiple sclerosis, history of CVA who is from [facility], is wheelchair-bound at baseline, was brought in secondary to fever and also difficulty breathing noticed today. The patient cannot communicate very well secondary to her underlying multiple sclerosis. Most of the history is obtained from her sister who is also the patient's power of attorney and is at bedside. According to her, the patient appeared to be at her baseline yesterday but last night she kind of appeared weak and not very communicative. However, she had a good appetite. She finished her dinner so the sister did not worry. This afternoon the sister went to check on the patient. At the time the ambulance was there and they were trying to get the patient to the hospital because she had a fever. She was having trouble breathing, and she was also noted to be hypoxic. The last discharge in September 2015 the patient was discharged on oxygen, but according to the sister she has been off oxygen for the last 2 months now at least. She was 88% on room air when she was brought in. She has a high white count of 21,000 and with a fever at the nursing home, she is being admitted for possible sepsis. The patient's chest x-ray indicates that she has right lower lung air space disease which is actually improving from prior. She also has a urinary tract infection. No aspiration episodes are reported. The patient is on a pureed diet with thin liquids and also uses straws.   PAST MEDICAL HISTORY:  1.  Significant for multiple sclerosis with wheelchair bound status.  2.  Anxiety,  depression.  3.  History of CVA and right-sided weakness.   PAST SURGICAL HISTORY: Surgery for her multiple sclerosis. She also has a pacemaker in place.   ALLERGIES TO MEDICATIONS: Fosamax.   CURRENT MEDICATIONS:  1.  Albuterol 3 mL nebulizer once a day in the morning.  2.  Atropine 1% ophthalmic solution for increased secretions.  3.  DuoNebs as needed q. 4 hours p.r.n.  4.  Effexor 150 mg p.o. daily.  5.  Hydroxyzine 25 mg p.o. b.i.d.  6.  Levaquin was started today 500 mg p.o. daily.  7.  Levemir 10 units at bedtime subcutaneous.  8.  Ativan 0.5 mg q. 4 hours p.r.n. for anxiety.  9.  Mirtazapine 15 mg at bedtime.  10.   Senokot 3 times a day for constipation.  11.   Tramadol 100 mg 1 time a day as needed for pain.  12.   Trazodone 50 mg p.o. at bedtime.    SOCIAL HISTORY: The patient is wheelchair bound. No smoking or alcohol use. Her sister is her POA.   FAMILY HISTORY: Not obtainable.   REVIEW OF SYSTEMS: Not obtainable secondary to the patient's mental status.   PHYSICAL EXAMINATION: VITAL SIGNS: Temperature 98.4 degrees Fahrenheit , pulse 122, respirations 26, blood pressure 128/62, pulse oximetry 88% on room air.  GENERAL: A thin built, well-nourished female lying in bed, very restless secondary to the oxygen placed on her.  HEENT: Normocephalic, atraumatic. Pupils, right eye is 4 mm with reaction to light; left eye is  2 mm with sluggish reaction to light. Anicteric sclerae. Extraocular movements intact. Oropharynx with dry mucous membranes. No erythema, mass or icterus.  NECK: Supple. No thyromegaly, JVD, or carotid bruits.  No lymphadenopathy.  LUNGS: Moving air bilaterally. Decreased bibasilar breath sounds. No wheeze or crackles. Mild rhonchi heard. No use of accessory muscles for breathing.   CARDIOVASCULAR: S1, S2, regular rate and rhythm. No murmurs, rubs, or gallops.  ABDOMEN: Soft, nontender, nondistended. No hepatosplenomegaly. Normal bowel sounds.  EXTREMITIES:  No pedal edema. No clubbing or cyanosis, 2+ dorsalis pedis pulses palpable bilaterally.  SKIN: No acne, rash or lesions.  LYMPHATICS: No cervical lymphadenopathy.  NEUROLOGIC: Unable to do a complete neurologic exam secondary to patient's noncooperation. She is moving her left side better than the right side. Upper extremity strength on the right side appears to be 2 to 3/5 and left upper extremity is 4/5 both lower extremities. She is withdrawing to pain.  PSYCHOLOGICAL: She seems alert, not sure about orientation because she is unable to answer any questions. She is very noncompliant.   LABORATORY DATA: Urinalysis with 2+ leukocyte esterase, few WBCs, and 1+ bacteria, lactic acid elevated at 3.2.   ABG showing pH of 7.48, pCO2 of 38, pO2 of 130, bicarbonate 28, and saturations of 97% on 100% nonrebreather mask. INR 1.3. Troponin less than 0.02. Magnesium 2.4.   WBC is 21.6, hemoglobin 12.7, hematocrit 41.4, platelet count 262,000.   Sodium 159, potassium 3.3, chloride 122, bicarbonate 28, BUN 40, creatinine 1.1, glucose 294, and calcium of 8.9.   ALT 26, AST 23, alkaline phosphatase 205, total bilirubin 0.2, and albumin of 2.1.   Chest x-ray: Interval improvement in right lower lung air space disease, no significant change in patchy left lower lobe airspace disease.   ASSESSMENT AND PLAN: A 69 year old female with multiple sclerosis, cerebrovascular accident, wheelchair bound,  anxiety, depression, previous admission for healthcare-acquired pneumonia, brought in for pneumonia, acute respiratory failure.  1.  Acute hypoxic respiratory failure with tachypnea, hypoxia on admission and also sepsis. Could be again healthcare-acquired pneumonia. She is from skilled nursing facility, so blood cultures were ordered and the patient is started on broad-spectrum antibiotics with vancomycin, Zosyn, and Levaquin. Not sure if she has any trouble with aspiration. Will order a speech consult for her, especially  since she is on thin liquids over at the nursing home. Also has urinary tract infection.  Follow up cultures. On antibiotics.  2.  Hyponatremia. Free water deficit likely. IV dextrose and monitor.  3.  Hypokalemia, being replaced.  4.  Multiple sclerosis. At baseline wheelchair-bound. Not very communicative. DNR, was on hospice services up until recently. Just got released from hospice services 2 weeks ago.  5.  Anxiety. Continue home medications.   CODE STATUS: Do not resuscitate.   TIME SPENT ON ADMISSION: 50 minutes.    ____________________________ Enid Baasadhika Ita Fritzsche, MD rk:at D: 07/31/2014 20:50:10 ET T: 07/31/2014 21:15:03 ET JOB#: [id number]  cc: Enid Baasadhika Roxsana Riding, MD, <Dictator> Enid BaasADHIKA Kerith Sherley MD ELECTRONICALLY SIGNED 08/26/2014 18:23

## 2016-02-15 IMAGING — CR DG CHEST 1V PORT
1 series · 1 of 1 positions shown · non-contrast
Comparison: None available

CLINICAL DATA: sepsis

EXAM:
PORTABLE CHEST - 1 VIEW

[ap]
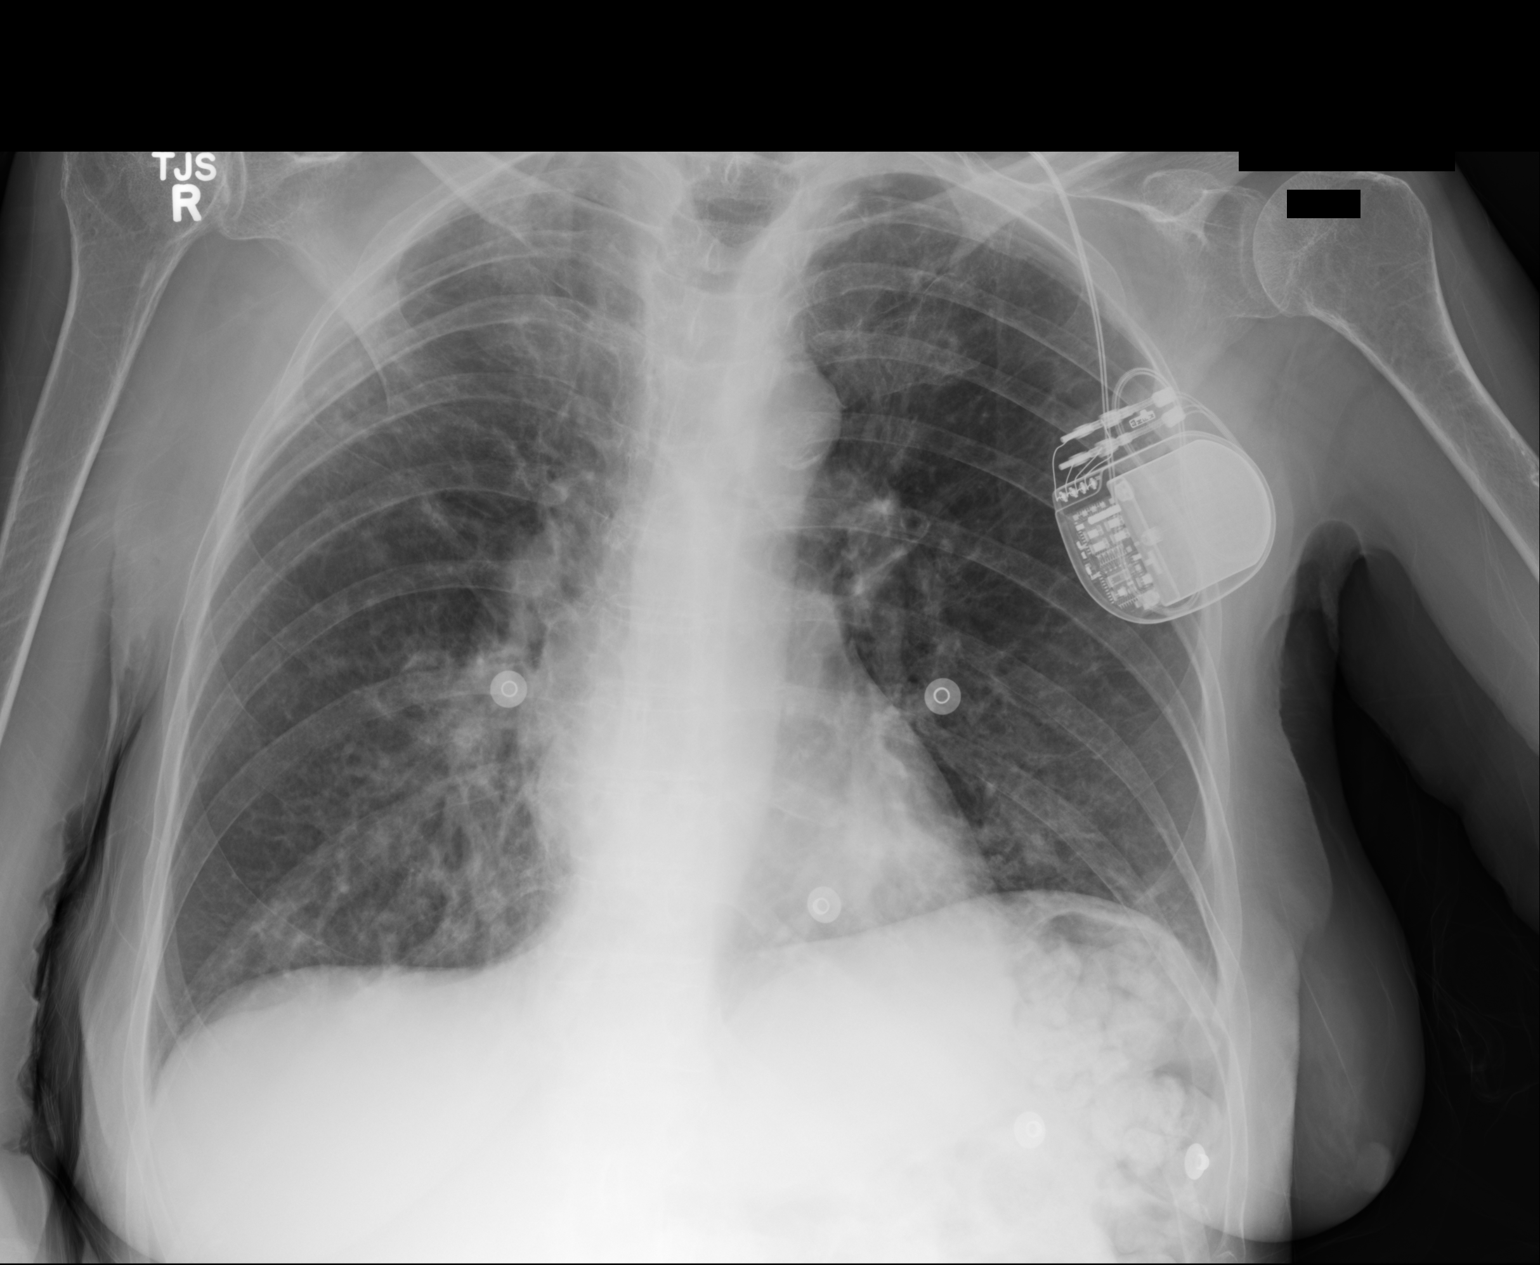

[1 of 1 positions shown; findings below may reference images not displayed]

FINDINGS: Coarse perihilar and infrahilar interstitial opacities right greater
than left. Sub cm nodular opacities in the right mid lung. Patchy
left retrocardiac airspace opacities. Heart size normal. Implanted
left-sided generator, distal aspect of lead extending above top edge
of image.
No effusion.
Visualized skeletal structures are unremarkable.
IMPRESSION: 1. Asymmetric coarse interstitial and airspace opacities as above,
of uncertain chronicity. In the absence of any prior outside films
for comparison, CT chest with contrast may be useful for further
characterization.
